# Patient Record
Sex: Female | Born: 1985 | Race: Black or African American | Hispanic: No | Marital: Single
Health system: Southern US, Community
[De-identification: ages and names within clinical notes are randomized; demographics above are authoritative.]

---

## 2006-04-08 ENCOUNTER — Emergency Department: Payer: Self-pay | Admitting: Emergency Medicine

## 2008-12-25 ENCOUNTER — Emergency Department: Payer: Self-pay | Admitting: Internal Medicine

## 2010-01-04 ENCOUNTER — Emergency Department: Payer: Self-pay | Admitting: Emergency Medicine

## 2010-04-16 IMAGING — CR RIGHT FOOT COMPLETE - 3+ VIEW
1 series · 4 of 4 positions shown · non-contrast
Comparison: none

REASON FOR EXAM: foot pain
COMMENTS:

PROCEDURE:     DXR - DXR FOOT RT COMPLETE W/OBLIQUES  - December 25, 2008  [DATE]
RESULT:     No fracture, dislocation or other acute bony abnormality is
identified.

[Series 1: view not recorded · 0.17mm/px · 4 of 4 slices shown]
[im 1/4]
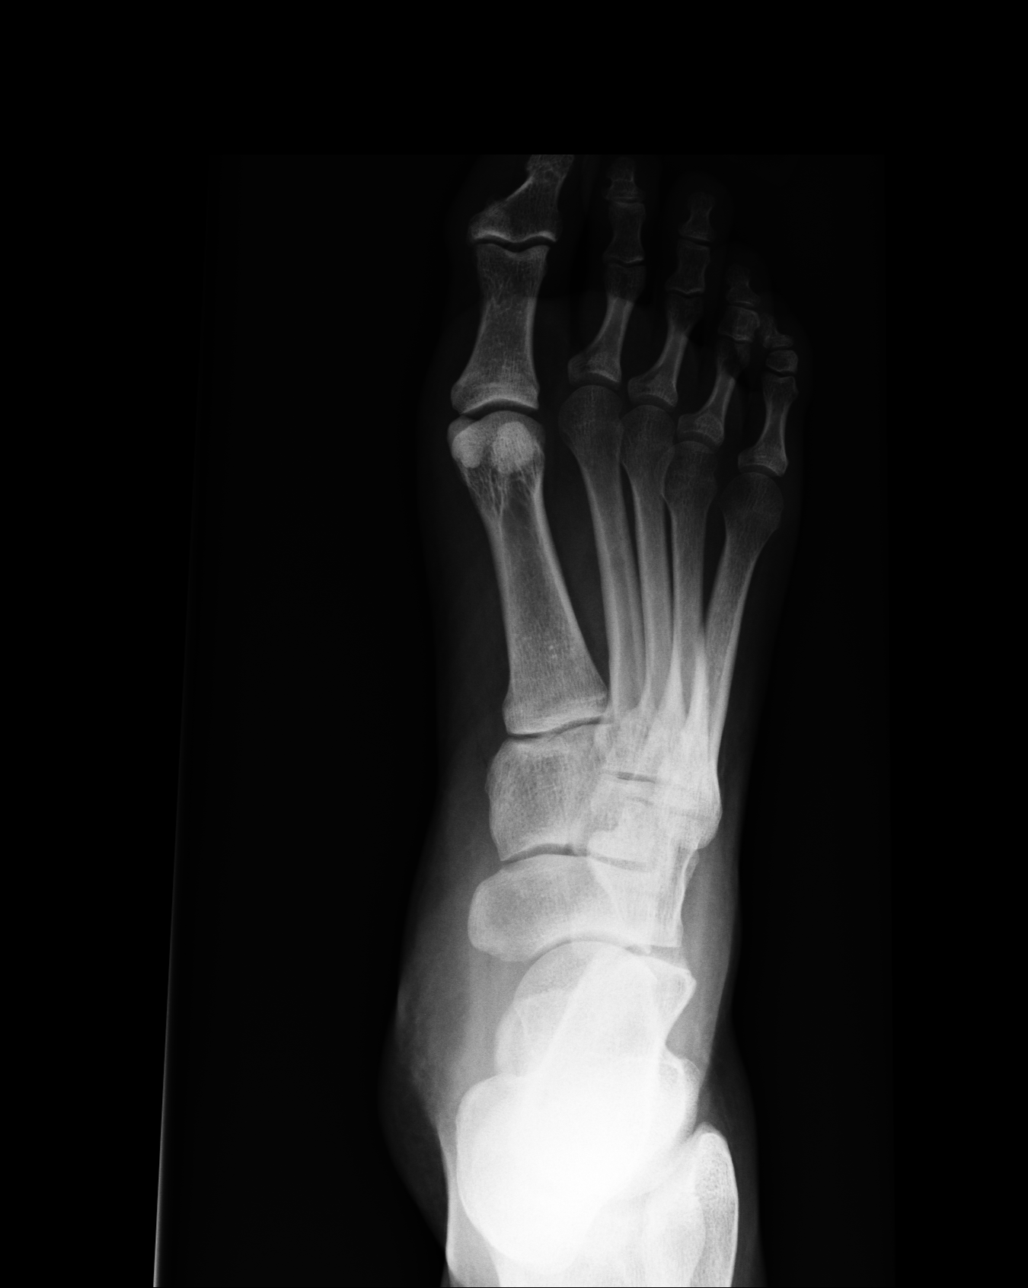
[im 2/4]
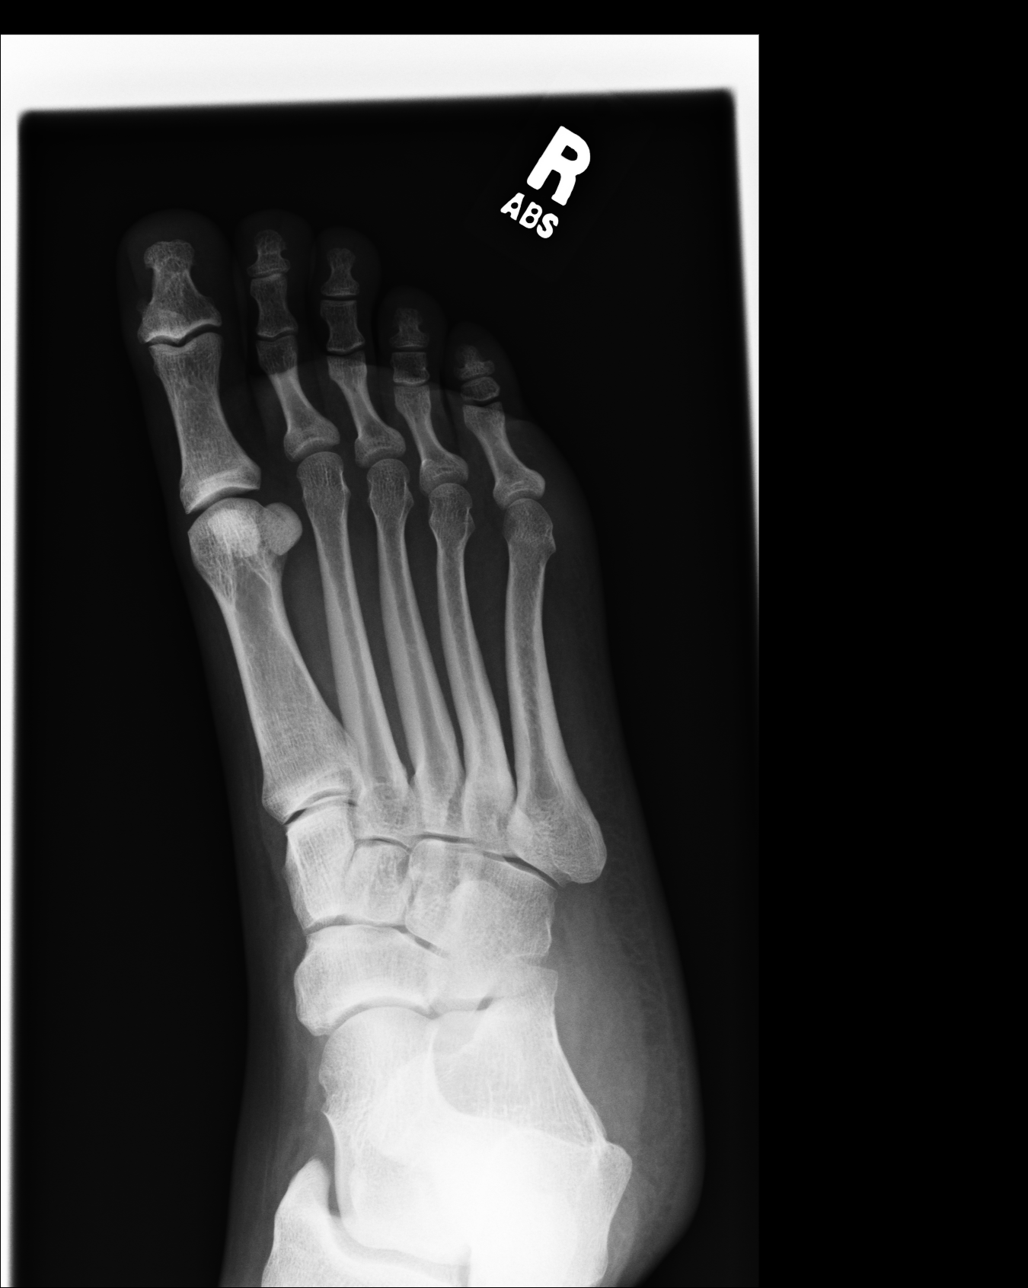
[im 3/4]
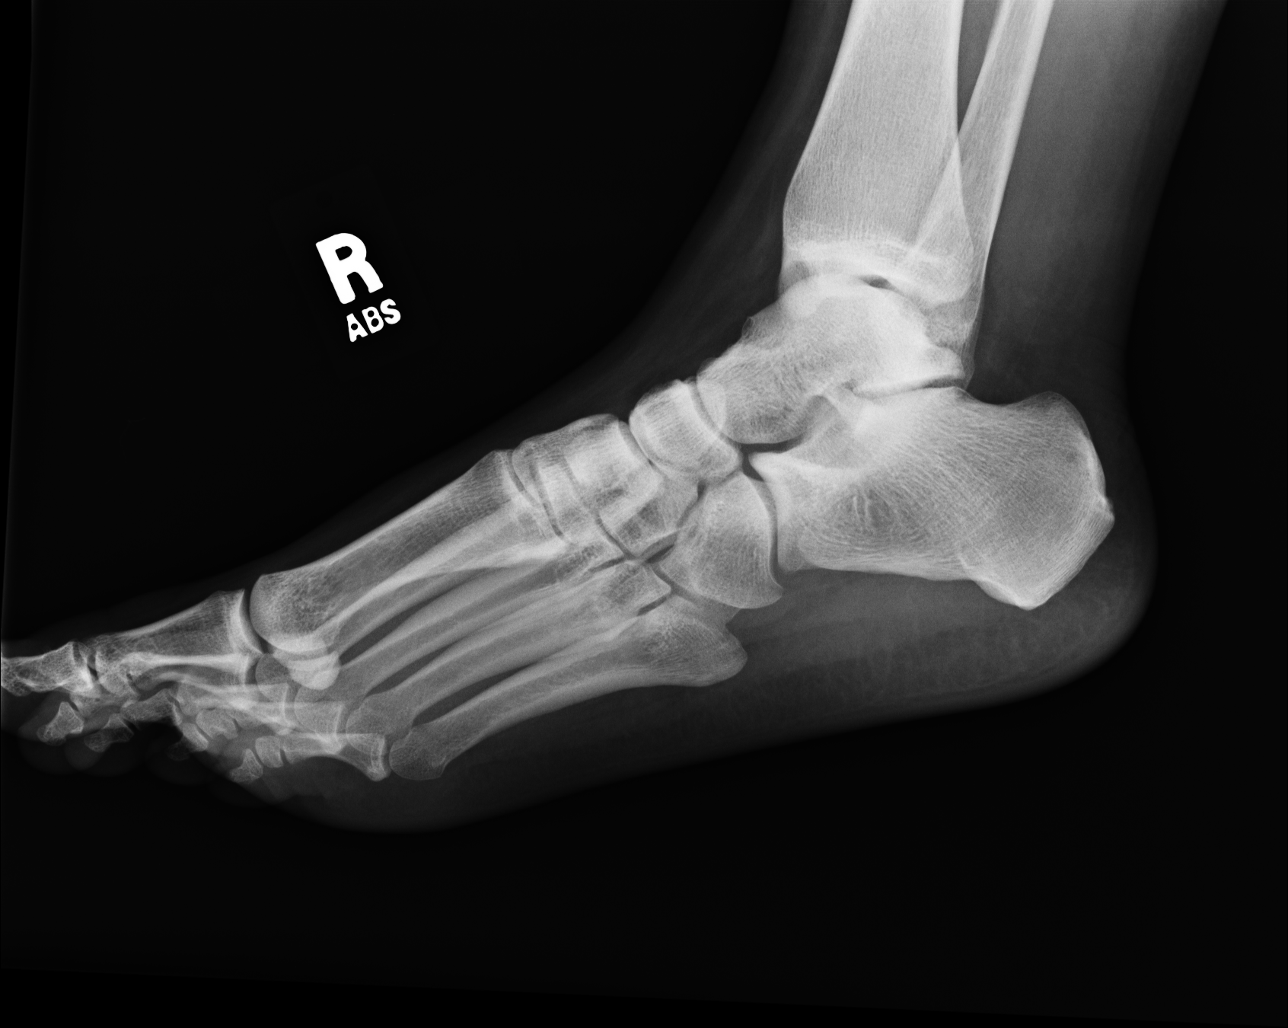
[im 4/4]
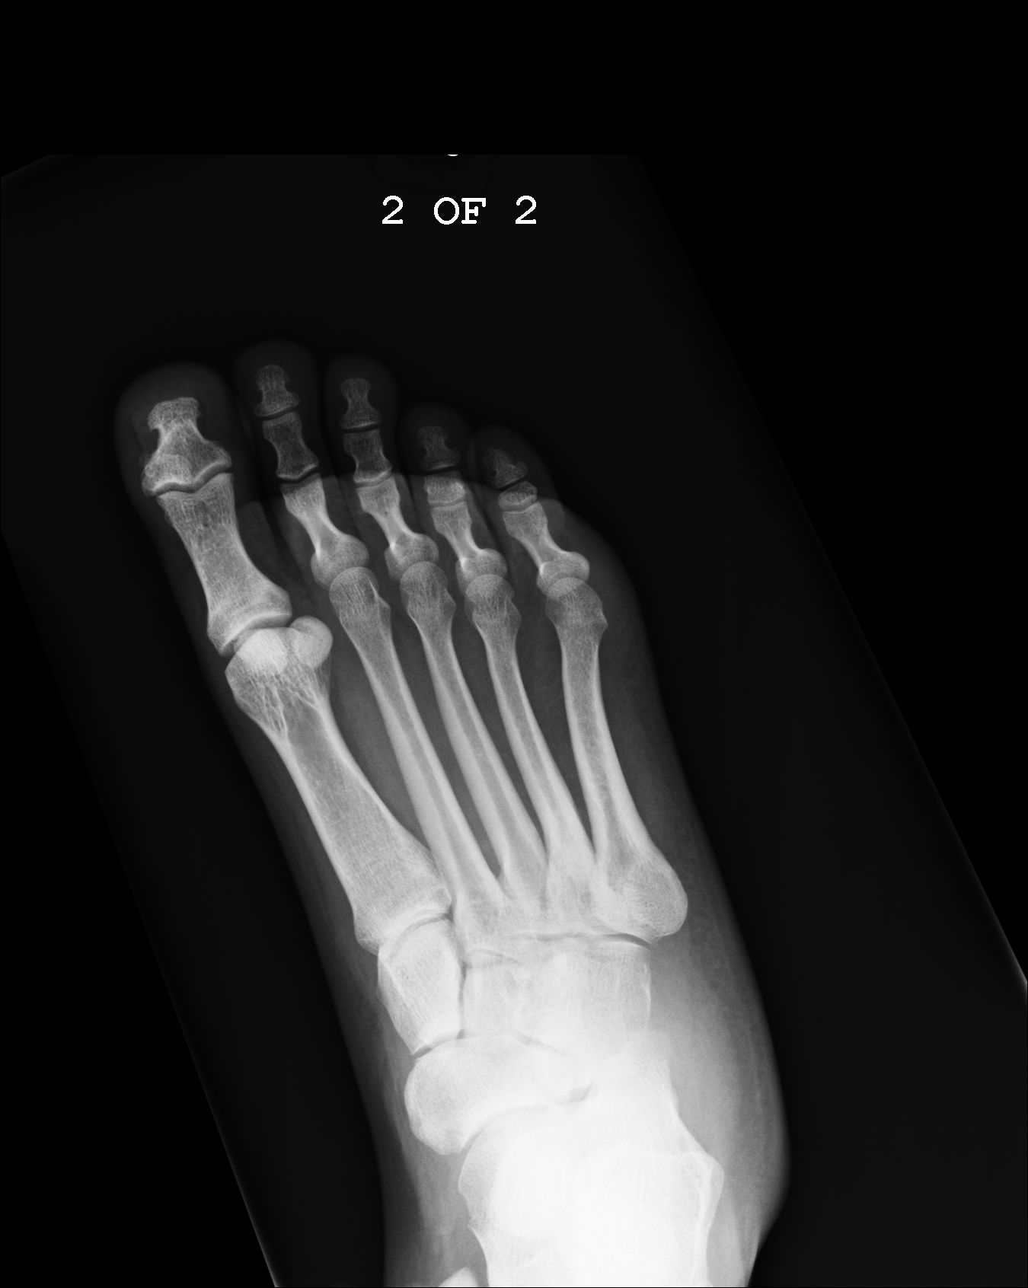

[4 of 4 positions shown; findings below may reference images not displayed]

IMPRESSION: No significant osseous abnormalities are noted.

## 2019-03-26 LAB — POCT LIPID PANEL
HDL: 47
LDL: 13
Non-HDL: 72
POC Glucose: 93 mg/dl (ref 70–99)
TC/HDL: 2.5
TC: 119
TRG: 294

## 2019-04-05 ENCOUNTER — Other Ambulatory Visit: Payer: Self-pay

## 2019-04-05 DIAGNOSIS — Z008 Encounter for other general examination: Secondary | ICD-10-CM

## 2019-04-05 NOTE — Progress Notes (Signed)
     Patient ID: Alison Oneal, female    DOB: 1986-03-02, 33 y.o.   MRN: [id number]    Thank you!!  Apolonio Schneiders RN  [facility] [place]: [phone]  Cell:  [phone] Website: [email or web address]
